# Patient Record
Sex: Male | Born: 1995 | Race: White | Hispanic: No | Marital: Single | Smoking: Current some day smoker
Health system: Southern US, Community
[De-identification: ages and names within clinical notes are randomized; demographics above are authoritative.]

## PROBLEM LIST (undated history)

## (undated) DIAGNOSIS — F419 Anxiety disorder, unspecified: Secondary | ICD-10-CM

## (undated) HISTORY — PX: ADENOIDECTOMY: SUR15

## (undated) HISTORY — PX: HERNIA REPAIR: SHX51

## (undated) HISTORY — PX: TONSILLECTOMY: SUR1361

---

## 2018-04-14 ENCOUNTER — Other Ambulatory Visit: Payer: Self-pay

## 2018-04-14 ENCOUNTER — Emergency Department (HOSPITAL_BASED_OUTPATIENT_CLINIC_OR_DEPARTMENT_OTHER): Payer: BLUE CROSS/BLUE SHIELD

## 2018-04-14 ENCOUNTER — Emergency Department (HOSPITAL_BASED_OUTPATIENT_CLINIC_OR_DEPARTMENT_OTHER)
Admission: EM | Admit: 2018-04-14 | Discharge: 2018-04-14 | Disposition: A | Discharge status: Home / Self Care | Payer: BLUE CROSS/BLUE SHIELD | Attending: Emergency Medicine | Admitting: Emergency Medicine

## 2018-04-14 ENCOUNTER — Encounter (HOSPITAL_BASED_OUTPATIENT_CLINIC_OR_DEPARTMENT_OTHER): Payer: Self-pay | Admitting: *Deleted

## 2018-04-14 DIAGNOSIS — Y939 Activity, unspecified: Secondary | ICD-10-CM | POA: Diagnosis not present

## 2018-04-14 DIAGNOSIS — W1789XA Other fall from one level to another, initial encounter: Secondary | ICD-10-CM | POA: Insufficient documentation

## 2018-04-14 DIAGNOSIS — Y929 Unspecified place or not applicable: Secondary | ICD-10-CM | POA: Insufficient documentation

## 2018-04-14 DIAGNOSIS — S2232XA Fracture of one rib, left side, initial encounter for closed fracture: Secondary | ICD-10-CM | POA: Diagnosis not present

## 2018-04-14 DIAGNOSIS — Y999 Unspecified external cause status: Secondary | ICD-10-CM | POA: Insufficient documentation

## 2018-04-14 DIAGNOSIS — F17210 Nicotine dependence, cigarettes, uncomplicated: Secondary | ICD-10-CM | POA: Insufficient documentation

## 2018-04-14 DIAGNOSIS — S299XXA Unspecified injury of thorax, initial encounter: Secondary | ICD-10-CM | POA: Diagnosis present

## 2018-04-14 DIAGNOSIS — Z79899 Other long term (current) drug therapy: Secondary | ICD-10-CM | POA: Insufficient documentation

## 2018-04-14 HISTORY — DX: Anxiety disorder, unspecified: F41.9

## 2018-04-14 LAB — URINALYSIS, MICROSCOPIC (REFLEX): Squamous Epithelial / LPF: NONE SEEN (ref 0–5)

## 2018-04-14 LAB — URINALYSIS, ROUTINE W REFLEX MICROSCOPIC
BILIRUBIN URINE: NEGATIVE
GLUCOSE, UA: NEGATIVE mg/dL
KETONES UR: NEGATIVE mg/dL
LEUKOCYTES UA: NEGATIVE
Nitrite: NEGATIVE
PROTEIN: NEGATIVE mg/dL
Specific Gravity, Urine: 1.02 (ref 1.005–1.030)
pH: 6 (ref 5.0–8.0)

## 2018-04-14 MED ORDER — KETOROLAC TROMETHAMINE 60 MG/2ML IM SOLN
60.0000 mg | Freq: Once | INTRAMUSCULAR | Status: AC
  Administered 2018-04-14: 60 mg via INTRAMUSCULAR
  Filled 2018-04-14: qty 2

## 2018-04-14 MED ORDER — IBUPROFEN 600 MG PO TABS: 600.0000 mg | ORAL_TABLET | Freq: Three times a day (TID) | ORAL | 0 refills | Status: AC | PRN

## 2018-04-14 MED ORDER — HYDROCODONE-ACETAMINOPHEN 5-325 MG PO TABS: 1.0000 | ORAL_TABLET | ORAL | 0 refills | Status: AC | PRN

## 2018-04-14 NOTE — ED Notes (Signed)
Family at bedside. 

## 2018-04-14 NOTE — ED Triage Notes (Signed)
Pt states he was "jumping up and down on a cooler" and the cooler slipped and he landed on the cooler on his left ribs. States he immediately vomited. Also states he has been drinking etoh today. Took 800 mg ibuprofen pta

## 2018-04-14 NOTE — ED Notes (Signed)
Patient is alert and orientedx4.  Patient was explained discharge instructions and they understood them with no questions.  The patient's significant, Chloe Albrite is taking the patient home.

## 2018-04-14 NOTE — Discharge Instructions (Addendum)
As we discussed:  For pain, 1.) I'd recommend taking Ibuprofen 600 mg every 8 hours for 5 days; then take as needed - this will keep the pain somewhat under control 2.) When pain is severe, take the Norco. Do NOT drink alcohol or smoke marijuana when taking this pain medication. Do not drive when taking it.  Return to the ER immediately if you develop: Blood in your urine Abdominal pain Nausea/vomiting Any other concerning symptoms Blood in stool

## 2018-04-14 NOTE — ED Provider Notes (Signed)
[facility] EMERGENCY DEPARTMENT Provider Note   CSN: [id number] Arrival date & time: 04/14/18  1740     History   Chief Complaint Chief Complaint  Patient presents with  . Fall    HPI Jimmy Adams is a 22 y.o. male.  HPI   22 year old male here with left flank pain.  The patient was drinking today.  He was standing on a cooler when it slipped, throwing him backwards.  He landed on his left upper flank on the cooler.  Reports immediate onset of severe, stabbing, sharp, left-sided chest wall pain.  The pain is worse with movement palpation.  He vomited once after the fall.  Did not hit his head there is no loss conscious.  Since then, he states the pain is significantly severe and worse with any kind of movement.  Denies any further episodes of nausea or vomiting.  No diarrhea.  No fevers or chills.  No head injury.  He is not on blood thinners.  Denies any alleviating factors.  Past Medical History:  Diagnosis Date  . Anxiety     There are no active problems to display for this patient.   Past Surgical History:  Procedure Laterality Date  . ADENOIDECTOMY    . HERNIA REPAIR    . TONSILLECTOMY          Home Medications    Prior to Admission medications   Medication Sig Start Date End Date Taking? Authorizing Provider  escitalopram (LEXAPRO) 10 MG tablet Take 10 mg by mouth daily.   Yes [provider]  HYDROcodone-acetaminophen (NORCO/VICODIN) 5-325 MG tablet Take 1-2 tablets by mouth every 4 (four) hours as needed for moderate pain or severe pain. 04/14/18   Shaune Pollack, MD  ibuprofen (ADVIL,MOTRIN) 600 MG tablet Take 1 tablet (600 mg total) by mouth every 8 (eight) hours as needed for moderate pain. 04/14/18   Shaune Pollack, MD    Family History No family history on file.  Social History Social History   Tobacco Use  . Smoking status: Current Some Day Smoker    Types: Cigarettes  . Smokeless tobacco: Never Used  Substance Use Topics    . Alcohol use: Yes  . Drug use: Yes    Types: Marijuana     Allergies   Patient has no known allergies.   Review of Systems Review of Systems  Constitutional: Negative for chills, fatigue and fever.  HENT: Negative for congestion and rhinorrhea.   Eyes: Negative for visual disturbance.  Respiratory: Positive for shortness of breath. Negative for cough and wheezing.   Cardiovascular: Positive for chest pain. Negative for leg swelling.  Gastrointestinal: Negative for abdominal pain, diarrhea, nausea and vomiting.  Genitourinary: Negative for dysuria and flank pain.  Musculoskeletal: Negative for neck pain and neck stiffness.  Skin: Negative for rash and wound.  Allergic/Immunologic: Negative for immunocompromised state.  Neurological: Negative for syncope, weakness and headaches.  All other systems reviewed and are negative.    Physical Exam Updated Vital Signs BP 135/77 (BP Location: Right Arm)   Pulse 61   Temp 99.1 F (37.3 C) (Oral)   Resp 18   Ht 5\' 8"  (1.727 m)   Wt 76.2 kg   SpO2 100%   BMI 25.54 kg/m   Physical Exam  Constitutional: He is oriented to person, place, and time. He appears well-developed and well-nourished. No distress.  HENT:  Head: Normocephalic and atraumatic.  Eyes: Conjunctivae are normal.  Neck: Neck supple.  Cardiovascular: Normal rate,  regular rhythm and normal heart sounds. Exam reveals no friction rub.  No murmur heard. Pulmonary/Chest: Effort normal and breath sounds normal. No respiratory distress. He has no wheezes. He has no rales.  Exquisite tenderness to palpation over the left upper lateral chest, just lateral to the scapula and below it.  No bruising or deformity.  Abdominal: He exhibits no distension.  Mild tenderness on palpation of the left upper quadrant, which localizes to his upper chest wall.  No rebound or guarding.  No other bruising or signs of trauma.  Musculoskeletal: He exhibits no edema.  Neurological: He is  alert and oriented to person, place, and time. He exhibits normal muscle tone.  Skin: Skin is warm. Capillary refill takes less than 2 seconds.  Psychiatric: He has a normal mood and affect.  Nursing note and vitals reviewed.    ED Treatments / Results  Labs (all labs ordered are listed, but only abnormal results are displayed) Labs Reviewed  URINALYSIS, ROUTINE W REFLEX MICROSCOPIC - Abnormal; Notable for the following components:      Result Value   Hgb urine dipstick SMALL (*)    All other components within normal limits  URINALYSIS, MICROSCOPIC (REFLEX) - Abnormal; Notable for the following components:   Bacteria, UA RARE (*)    All other components within normal limits    EKG None  Radiology Dg Ribs Unilateral W/chest Left  Result Date: 04/14/2018 CLINICAL DATA:  22 year old male with history of trauma from fall off of a cooler. Chest pain. EXAM: LEFT RIBS AND CHEST - 3+ VIEW COMPARISON:  No priors. FINDINGS: Subtle nondisplaced fracture of the post row lateral aspect of the left ninth rib. There is no evidence of pneumothorax or pleural effusion. Both lungs are clear. Heart size and mediastinal contours are within normal limits. IMPRESSION: 1. Nondisplaced fracture of post row lateral aspect of the left ninth rib. No pneumothorax or other signs of significant acute traumatic injury to the thorax. Electronically Signed   By: Trudie Reed M.D.   On: 04/14/2018 19:05    Procedures Procedures (including critical care time)  Medications Ordered in ED Medications  ketorolac (TORADOL) injection 60 mg (has no administration in time range)     Initial Impression / Assessment and Plan / ED Course  I have reviewed the triage vital signs and the nursing notes.  Pertinent labs & imaging results that were available during my care of the patient were reviewed by me and considered in my medical decision making (see chart for details).     22 year old male here with trauma from  a fall off of a cooler.  He fell directly onto his left upper chest.  Imaging shows nondisplaced lateral left ninth rib fracture.  Patient abdomen is soft and nontender and he has no hematuria (trace heme on dipstick but 0-5 RBCs, no casts), no nausea, no vomiting, no tachycardia, no other evidence to suggest intra-abdominal or retroperitoneal injury.  Patient monitored in the ED for several hours with no change in vitals.  He is pulling over 2000 on incentive spirometer.  I discussed low suspicion for intra-abdominal or retroperitoneal trauma with the patient, and will elect for symptom medic treatment of rib fracture at this time with very good return precautions.  This was discussed with him and his significant other in detail.  Will discharge home. Abdomen soft, NT, ND on serial exams.  Final Clinical Impressions(s) / ED Diagnoses   Final diagnoses:  Closed fracture of one rib of left  side, initial encounter    ED Discharge Orders         Ordered    ibuprofen (ADVIL,MOTRIN) 600 MG tablet  Every 8 hours PRN     04/14/18 2016    HYDROcodone-acetaminophen (NORCO/VICODIN) 5-325 MG tablet  Every 4 hours PRN     04/14/18 2016           Shaune PollackIsaacs, Shanyia Stines, MD 04/14/18 2018

## 2019-05-13 IMAGING — CR DG RIBS W/ CHEST 3+V*L*
4 series · 4 of 4 positions shown · non-contrast
Comparison: No priors.

CLINICAL DATA: 22-year-old male with history of trauma from fall
off of a cooler. Chest pain.

EXAM:
LEFT RIBS AND CHEST - 3+ VIEW

[w chest pa]
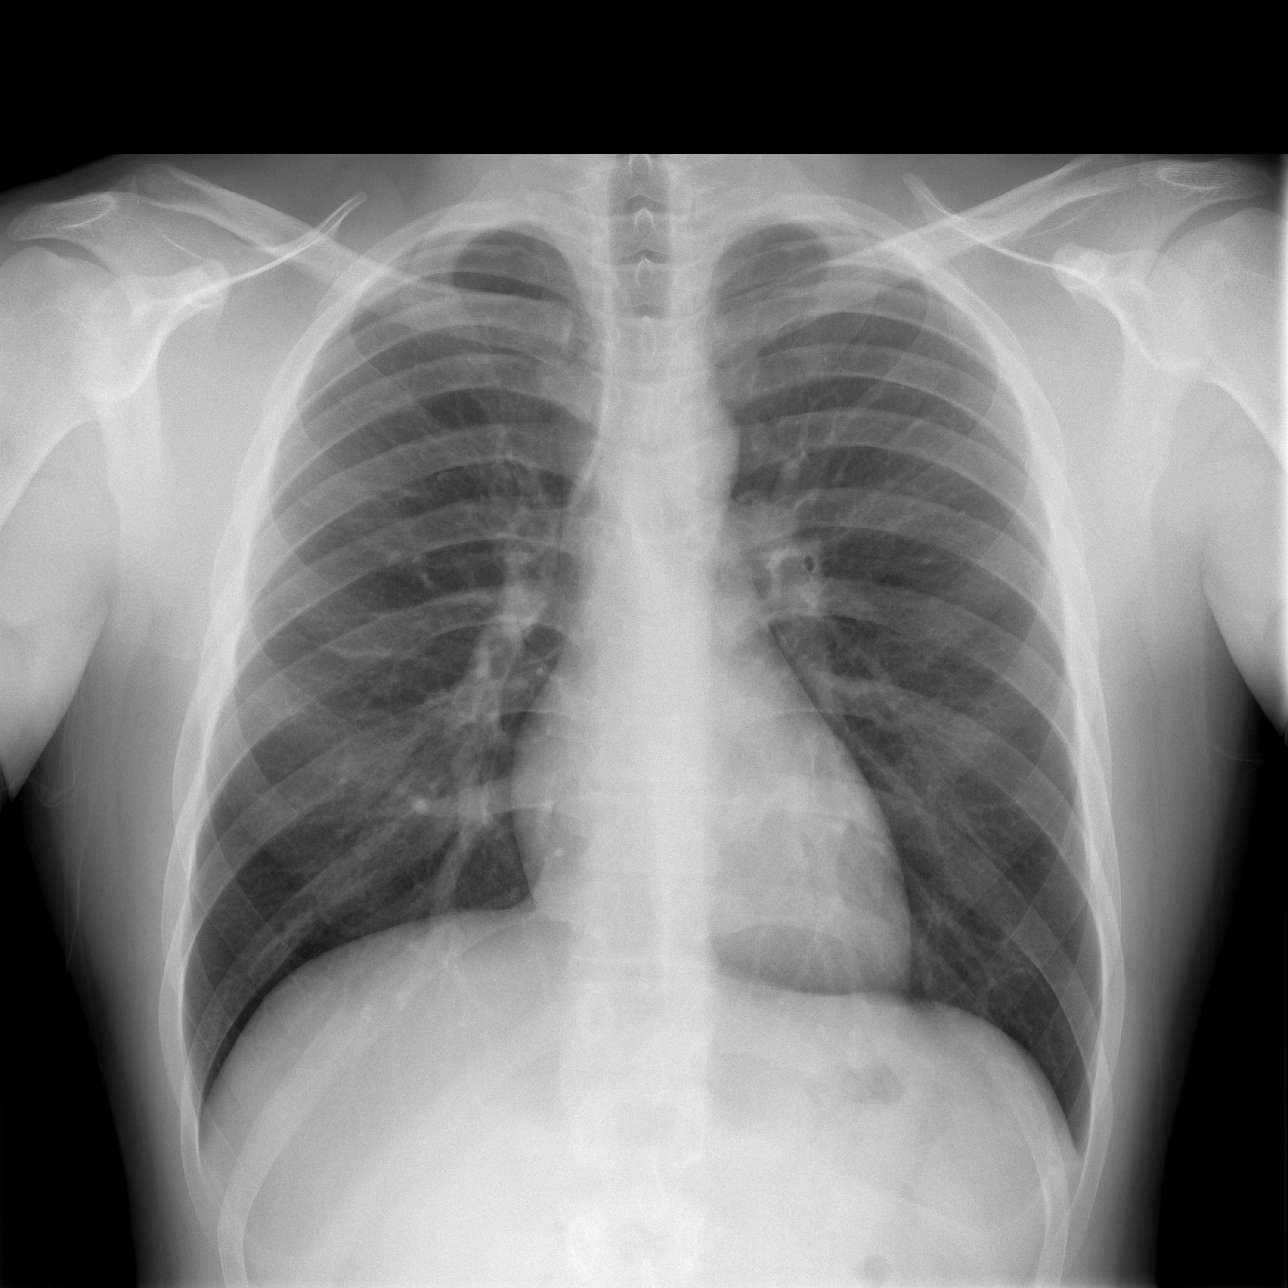

[w ribs ap/pa lower left (1 of 2)]
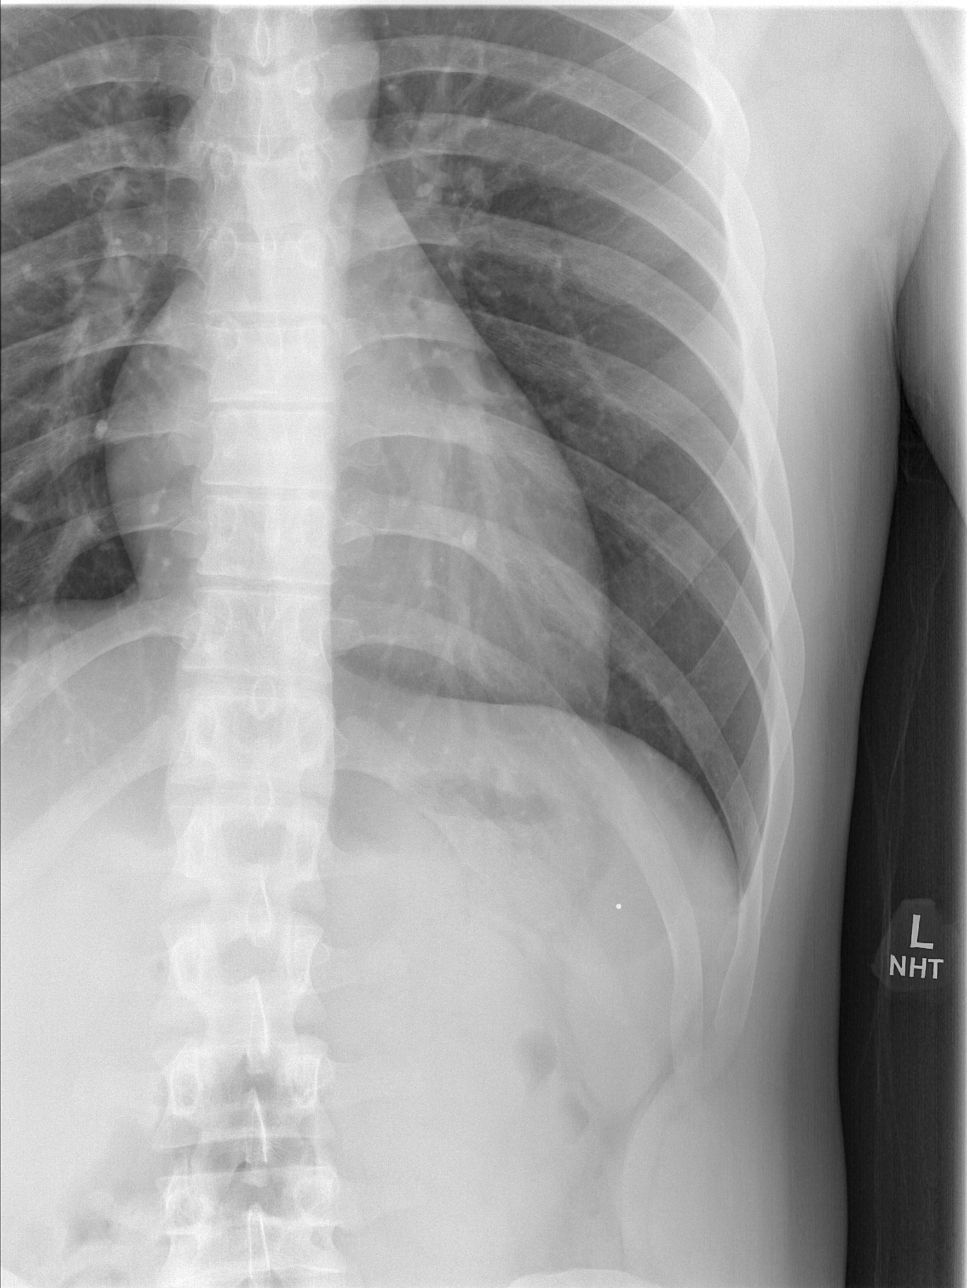

[w ribs ap/pa lower left (2 of 2)]
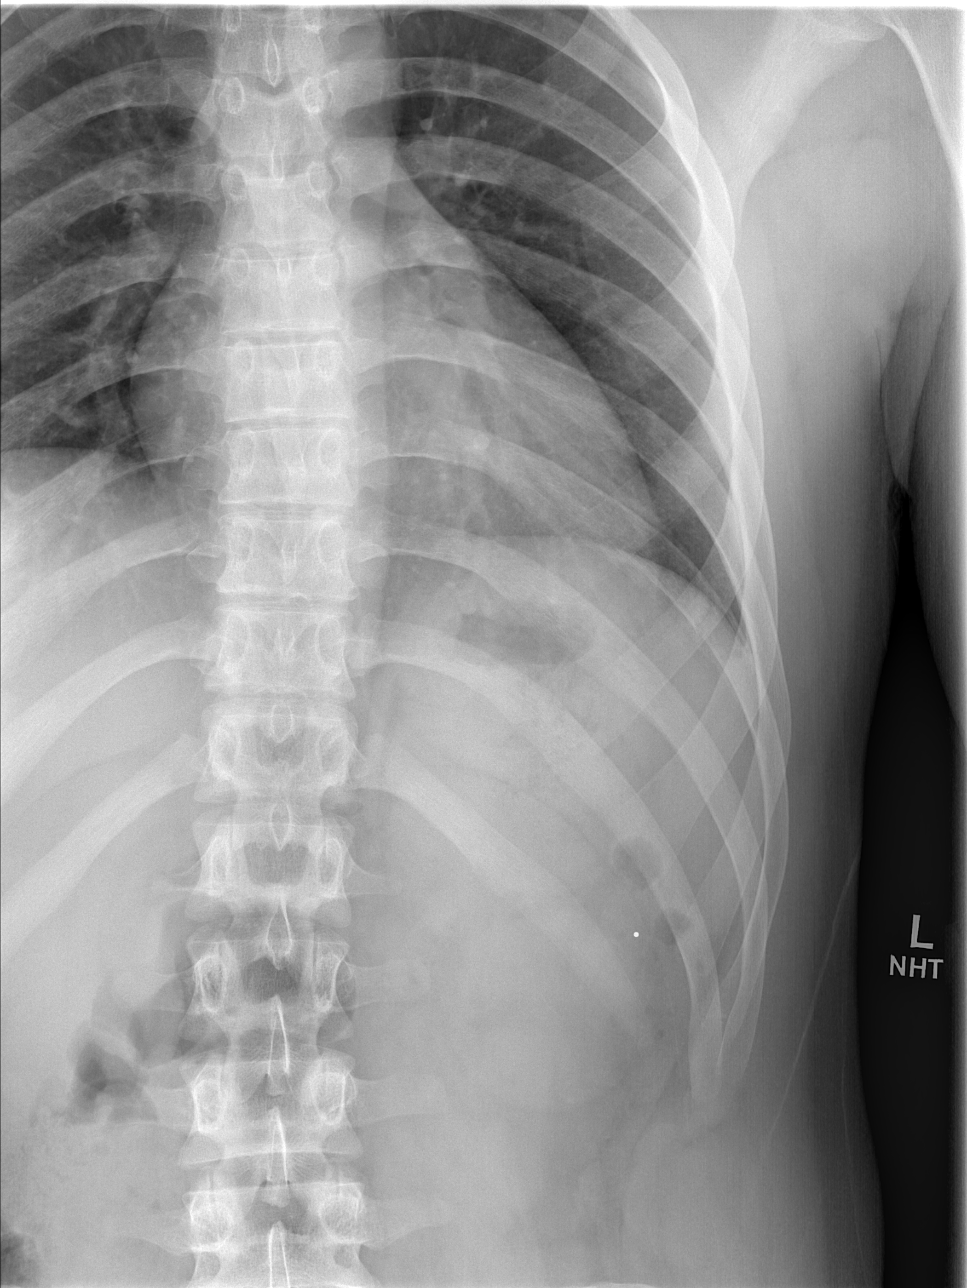

[w ribs oblique left]
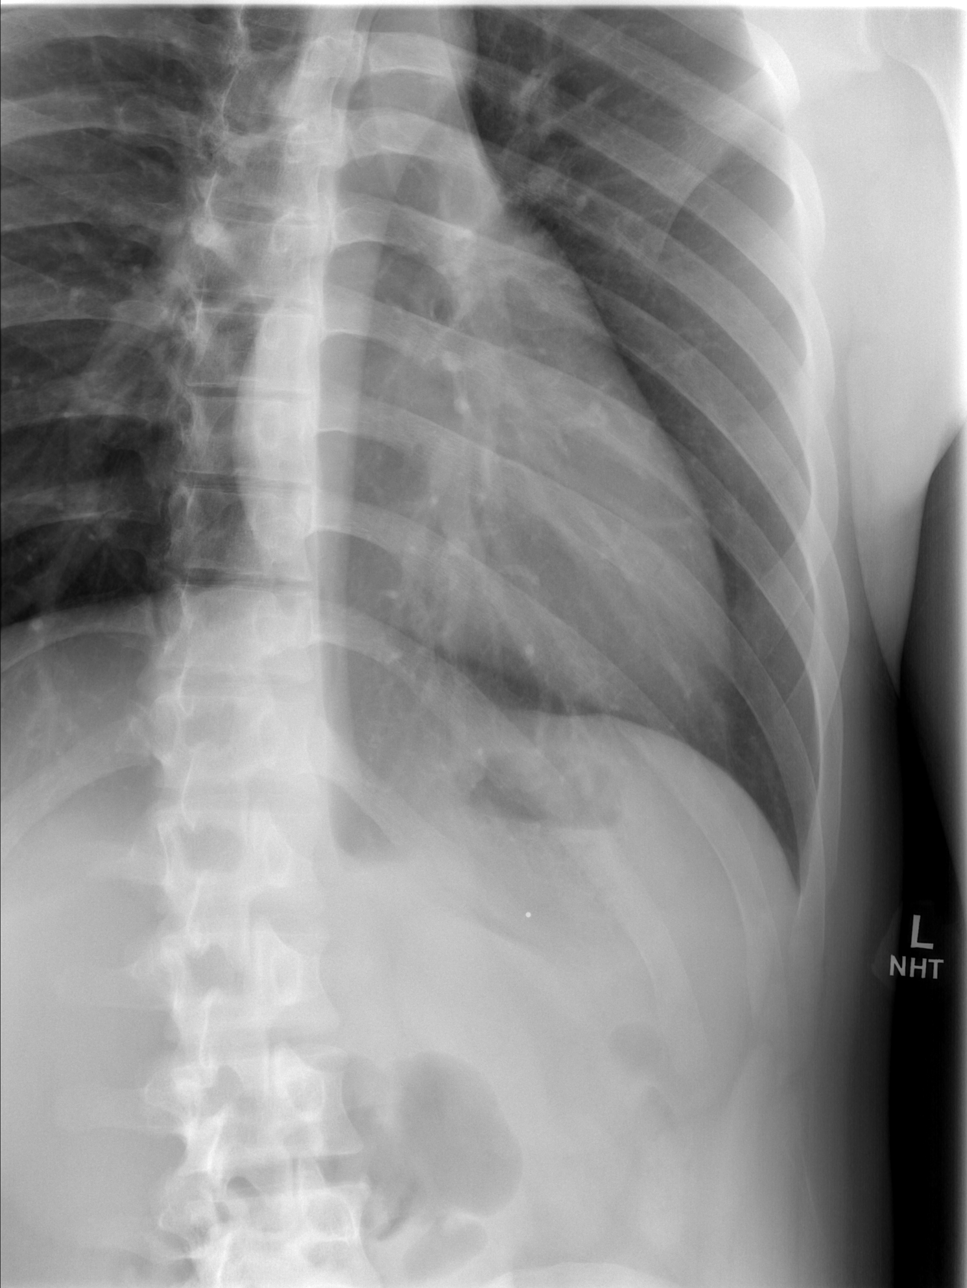

[4 of 4 positions shown; findings below may reference images not displayed]

FINDINGS: Subtle nondisplaced fracture of the post row lateral aspect of the
left ninth rib. There is no evidence of pneumothorax or pleural
effusion. Both lungs are clear. Heart size and mediastinal contours
are within normal limits.
IMPRESSION: 1. Nondisplaced fracture of post row lateral aspect of the left
ninth rib. No pneumothorax or other signs of significant acute
traumatic injury to the thorax.
